# Patient Record
Sex: Male | Born: 1995 | Race: Black or African American | Hispanic: No | Marital: Single | Smoking: Never smoker
Health system: Southern US, Community
[De-identification: ages and names within clinical notes are randomized; demographics above are authoritative.]

## PROBLEM LIST (undated history)

## (undated) DIAGNOSIS — F419 Anxiety disorder, unspecified: Secondary | ICD-10-CM

## (undated) DIAGNOSIS — L309 Dermatitis, unspecified: Secondary | ICD-10-CM

## (undated) HISTORY — DX: Anxiety disorder, unspecified: F41.9

---
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
## 2018-09-20 ENCOUNTER — Other Ambulatory Visit: Payer: Self-pay

## 2018-09-20 ENCOUNTER — Emergency Department (HOSPITAL_COMMUNITY)
Admission: EM | Admit: 2018-09-20 | Discharge: 2018-09-20 | Disposition: A | Discharge status: Home / Self Care | Payer: BC Managed Care – PPO | Attending: Emergency Medicine | Admitting: Emergency Medicine

## 2018-09-20 ENCOUNTER — Encounter (HOSPITAL_COMMUNITY): Payer: Self-pay

## 2018-09-20 ENCOUNTER — Emergency Department (HOSPITAL_COMMUNITY): Payer: BC Managed Care – PPO

## 2018-09-20 DIAGNOSIS — Y999 Unspecified external cause status: Secondary | ICD-10-CM | POA: Insufficient documentation

## 2018-09-20 DIAGNOSIS — Y9301 Activity, walking, marching and hiking: Secondary | ICD-10-CM | POA: Insufficient documentation

## 2018-09-20 DIAGNOSIS — Z79899 Other long term (current) drug therapy: Secondary | ICD-10-CM | POA: Insufficient documentation

## 2018-09-20 DIAGNOSIS — Y929 Unspecified place or not applicable: Secondary | ICD-10-CM | POA: Diagnosis not present

## 2018-09-20 DIAGNOSIS — S93601A Unspecified sprain of right foot, initial encounter: Secondary | ICD-10-CM | POA: Diagnosis not present

## 2018-09-20 DIAGNOSIS — S99921A Unspecified injury of right foot, initial encounter: Secondary | ICD-10-CM | POA: Diagnosis present

## 2018-09-20 DIAGNOSIS — X503XXA Overexertion from repetitive movements, initial encounter: Secondary | ICD-10-CM | POA: Diagnosis not present

## 2018-09-20 MED ORDER — NAPROXEN 375 MG PO TABS: 375.0000 mg | ORAL_TABLET | Freq: Two times a day (BID) | ORAL | 0 refills | Status: AC

## 2018-09-20 MED ORDER — NAPROXEN 500 MG PO TABS
500.0000 mg | ORAL_TABLET | Freq: Once | ORAL | Status: AC
  Administered 2018-09-20: 500 mg via ORAL
  Filled 2018-09-20: qty 1

## 2018-09-20 NOTE — ED Provider Notes (Signed)
[facility]-EMERGENCY DEPT Provider Note   CSN: [id number] Arrival date & time: 09/20/18  1208    History   Chief Complaint Chief Complaint  Patient presents with  . Foot Injury    HPI Carlos Underwood is a 23 y.o. male.     23 y/o male with a PMH of Eczema presents to the ED with a chief complaint of right ankle pain x 2 days. 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 Patient reports pain along the dorsum aspect of his foot, states its worse with dorsiflexion and ambulation. He has taken tylenol for his pain, along with ace wrap it but reports no relieve in symptoms. He is also ambulating with crutches which helps. He denies any fever, other complaints or injury at this time.     Past Medical History:  Diagnosis Date  . Eczema     There are no active problems to display for this patient.   History reviewed. No pertinent surgical history.      Home Medications    Prior to Admission medications   Medication Sig Start Date End Date Taking? Authorizing Provider  acetaminophen (TYLENOL) 500 MG tablet Take 1,000 mg by mouth every 6 (six) hours as needed. For pain    [provider]  amoxicillin (AMOXIL) 500 MG capsule Take 2 capsules (1,000 mg total) by mouth 2 (two) times daily. 12/03/15   Mat Carne, MD  cephALEXin (KEFLEX) 500 MG capsule Take 1 capsule (500 mg total) by mouth 4 (four) times daily. 09/04/15   Elpidio Anis, PA-C  Fexofenadine HCl (MUCINEX ALLERGY PO) Take by mouth.    [provider]  HYDROcodone-acetaminophen (NORCO/VICODIN) 5-325 MG tablet Take 1-2 tablets by mouth every 4 (four) hours as needed. 09/04/15   Elpidio Anis, PA-C  hydrOXYzine (ATARAX/VISTARIL) 25 MG tablet Take 1 tablet (25 mg total) by mouth every 6 (six) hours as needed for anxiety. 08/27/16   Earley Favor, NP  ibuprofen (ADVIL,MOTRIN) 600 MG tablet Take 1 tablet (600 mg total) by mouth  every 6 (six) hours as needed. 09/04/15   Elpidio Anis, PA-C  ipratropium (ATROVENT) 0.06 % nasal spray Place 2 sprays into both nostrils 4 (four) times daily. 02/17/15   Hayden Rasmussen, NP  naproxen (NAPROSYN) 375 MG tablet Take 1 tablet (375 mg total) by mouth 2 (two) times daily for 7 days. 09/20/18 09/27/18  Claude Manges, PA-C    Family History Family History  Problem Relation Age of Onset  . Heart failure Mother   . Healthy Father     Social History Social History   Tobacco Use  . Smoking status: Never Smoker  . Smokeless tobacco: Never Used  Substance Use Topics  . Alcohol use: Yes    Comment: social   . Drug use: Yes    Types: Marijuana     Allergies   Patient has no known allergies.   Review of Systems Review of Systems  Constitutional: Negative for fever.  Musculoskeletal: Positive for arthralgias.     Physical Exam Updated Vital Signs BP 113/72 (BP Location: Right Arm)   Pulse (!) 51   Temp 98.1 F (36.7 C) (Oral)   Resp 14   Ht 6\' 3"  (1.905 m)   Wt 70.3 kg   SpO2 99%   BMI 19.37 kg/m   Physical Exam Vitals signs and nursing note reviewed.  Constitutional:      Appearance: He is well-developed.  HENT:  Head: Normocephalic and atraumatic.  Eyes:     General: No scleral icterus.    Pupils: Pupils are equal, round, and reactive to light.  Neck:     Musculoskeletal: Normal range of motion.  Cardiovascular:     Heart sounds: Normal heart sounds.  Pulmonary:     Effort: Pulmonary effort is normal.     Breath sounds: Normal breath sounds. No wheezing.  Chest:     Chest wall: No tenderness.  Abdominal:     General: Bowel sounds are normal. There is no distension.     Palpations: Abdomen is soft.     Tenderness: There is no abdominal tenderness.  Musculoskeletal:        General: No tenderness or deformity.     Right ankle: He exhibits swelling. He exhibits normal range of motion, no ecchymosis, no deformity, no laceration and normal pulse.        Feet:     Comments: Pulses present, capillary refill intact. 5/5 strength with dorsiflexion and plantarflexion.   Skin:    General: Skin is warm and dry.  Neurological:     Mental Status: He is alert and oriented to person, place, and time.      ED Treatments / Results  Labs (all labs ordered are listed, but only abnormal results are displayed) Labs Reviewed - No data to display  EKG None  Radiology Dg Ankle Complete Right  Result Date: 09/20/2018 CLINICAL DATA:  Twisted ankle. EXAM: RIGHT ANKLE - COMPLETE 3+ VIEW COMPARISON:  None. FINDINGS: Minimal soft tissue swelling about the lateral malleolus. No associated fracture or dislocation. Joint spaces are preserved. Ankle mortise is preserved. No definite ankle joint effusion. No plantar calcaneal spur. No radiopaque foreign body. IMPRESSION: Minimal soft tissue swelling about the lateral malleolus without associated fracture or dislocation. Electronically Signed   By: Simonne Come M.D.   On: 09/20/2018 13:28    Procedures Procedures (including critical care time)  Medications Ordered in ED Medications  naproxen (NAPROSYN) tablet 500 mg (500 mg Oral Given 09/20/18 1335)     Initial Impression / Assessment and Plan / ED Course  I have reviewed the triage vital signs and the nursing notes.  Pertinent labs & imaging results that were available during my care of the patient were reviewed by me and considered in my medical decision making (see chart for details).    Patient presents to the ED status post fall 2 days ago.  Reports pain around the right ankle, worse with dorsiflexion and ambulation.  Duration patient is got pulses, capillary refill is intact, strength is 5 out of 5.  Will obtain x-ray to rule out any acute fracture or dislocation.  X-ray of his right foot showed: Minimal soft tissue swelling about the lateral malleolus without  associated fracture or dislocation.     Patient does have an Ace wrap from home,  along with a boot that he reports getting from his brother.  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  Is requesting a work note at this time to return to work on Thursday.  Patient has been advised to elevate, rice therapy.  Will have patient follow-up with orthopedist as needed.  Patient understands and agrees with management at this time.  Vitals stable, ambulatory with crutches, stable for discharge at this time.     Final Clinical Impressions(s) / ED Diagnoses   Final diagnoses:  Sprain of right foot, initial encounter    ED Discharge Orders  Ordered    naproxen (NAPROSYN) 375 MG tablet  2 times daily     09/20/18 1343           Claude MangesSoto, Tu Bayle, PA-C 09/20/18 1352    Pricilla LovelessGoldston, Scott, MD 09/20/18 [id number]

## 2018-09-20 NOTE — ED Notes (Signed)
XR at bedside

## 2018-09-20 NOTE — ED Triage Notes (Signed)
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 Patient has swelling to the right foot and ankle. Patient has more pain on top of the foot, especially when applies weight.

## 2018-09-20 NOTE — Discharge Instructions (Addendum)
You xray today was normal, no fracture or dislocation. I have provided a prescription to help with the swelling, please take this as prescription.

## 2019-06-16 ENCOUNTER — Emergency Department (HOSPITAL_COMMUNITY)
Admission: EM | Admit: 2019-06-16 | Discharge: 2019-06-17 | Discharge status: Against Medical Advice | Payer: BC Managed Care – PPO

## 2019-06-16 NOTE — ED Notes (Signed)
Pt called for triage but no answer in lobby 

## 2019-06-17 ENCOUNTER — Encounter (HOSPITAL_COMMUNITY): Payer: Self-pay | Admitting: Emergency Medicine

## 2019-06-17 ENCOUNTER — Other Ambulatory Visit: Payer: Self-pay

## 2019-06-17 NOTE — ED Notes (Signed)
Pt called for room no answer

## 2019-06-17 NOTE — ED Notes (Signed)
Pt called for VS, no answer  

## 2019-06-17 NOTE — ED Notes (Signed)
Pt called x3 for vitals recheck, no answer. 

## 2019-06-17 NOTE — ED Triage Notes (Signed)
Patient reports stye at left lower eyelid onset this week with redness/swelling , no blurred vision or vision loss.

## 2020-02-07 ENCOUNTER — Emergency Department (HOSPITAL_COMMUNITY): Payer: BC Managed Care – PPO

## 2020-02-07 ENCOUNTER — Encounter (HOSPITAL_COMMUNITY): Payer: Self-pay | Admitting: Emergency Medicine

## 2020-02-07 ENCOUNTER — Emergency Department (HOSPITAL_COMMUNITY)
Admission: EM | Admit: 2020-02-07 | Discharge: 2020-02-08 | Disposition: A | Discharge status: Home / Self Care | Payer: BC Managed Care – PPO | Attending: Emergency Medicine | Admitting: Emergency Medicine

## 2020-02-07 DIAGNOSIS — S51812A Laceration without foreign body of left forearm, initial encounter: Secondary | ICD-10-CM | POA: Diagnosis present

## 2020-02-07 DIAGNOSIS — S61512A Laceration without foreign body of left wrist, initial encounter: Secondary | ICD-10-CM | POA: Diagnosis not present

## 2020-02-07 DIAGNOSIS — Z79899 Other long term (current) drug therapy: Secondary | ICD-10-CM | POA: Diagnosis not present

## 2020-02-07 DIAGNOSIS — S40922A Unspecified superficial injury of left upper arm, initial encounter: Secondary | ICD-10-CM | POA: Diagnosis not present

## 2020-02-07 DIAGNOSIS — Y939 Activity, unspecified: Secondary | ICD-10-CM | POA: Diagnosis not present

## 2020-02-07 DIAGNOSIS — W25XXXA Contact with sharp glass, initial encounter: Secondary | ICD-10-CM | POA: Insufficient documentation

## 2020-02-07 DIAGNOSIS — S4992XA Unspecified injury of left shoulder and upper arm, initial encounter: Secondary | ICD-10-CM

## 2020-02-07 DIAGNOSIS — Y999 Unspecified external cause status: Secondary | ICD-10-CM | POA: Diagnosis not present

## 2020-02-07 DIAGNOSIS — Y929 Unspecified place or not applicable: Secondary | ICD-10-CM | POA: Insufficient documentation

## 2020-02-07 DIAGNOSIS — T07XXXA Unspecified multiple injuries, initial encounter: Secondary | ICD-10-CM

## 2020-02-07 NOTE — ED Triage Notes (Signed)
Patient presents with laceration to RUE/wrist area after punching through glass window. Bleeding controlled. Tetanus not UTD

## 2020-02-08 ENCOUNTER — Encounter (HOSPITAL_COMMUNITY): Payer: Self-pay | Admitting: Student

## 2020-02-08 ENCOUNTER — Other Ambulatory Visit: Payer: Self-pay

## 2020-02-08 MED ORDER — CEPHALEXIN 500 MG PO CAPS: 500.0000 mg | ORAL_CAPSULE | Freq: Three times a day (TID) | ORAL | 0 refills | Status: DC

## 2020-02-08 MED ORDER — LIDOCAINE HCL (PF) 1 % IJ SOLN
30.0000 mL | Freq: Once | INTRAMUSCULAR | Status: AC
  Administered 2020-02-08: 08:00:00 30 mL
  Filled 2020-02-08: qty 30

## 2020-02-08 MED ORDER — BACITRACIN ZINC 500 UNIT/GM EX OINT
TOPICAL_OINTMENT | Freq: Once | CUTANEOUS | Status: AC
  Filled 2020-02-08: qty 0.9

## 2020-02-08 NOTE — ED Notes (Signed)
Sutures were covered with the ordered ointment, then with nonstick gauze and then wrapped with Kerlex. Then wrist brace was applied over this dressing. Pt was given D/C papers, all D/C instructions was explained & understood.

## 2020-02-08 NOTE — ED Provider Notes (Signed)
[facility] Provider [facility]   CSN: [id number] Arrival date & time: 02/07/20  1818     History Chief Complaint  Patient presents with  . Extremity Laceration    Carlos Underwood is a 24 y.o. male without significant past medical history who presents to the emergency department status post left upper extremity injurywithout significant past medical hx who presents to the ED S/p LUE injury with lacerations present which occurred around 1800 last night. Patient states that he was attempting to dunk a basketball inside and put his hand through a glass door sustaining multiple wounds. Wounds are painful, no alleviating/aggravating factors. No intervention PTA. Denies fever, chills, numbness, weakness, or other areas of injury. Patient is R hand dominant. Last tetanus within past 5 years.   HPI     Past Medical History:  Diagnosis Date  . Eczema     There are no problems to display for this patient.   History reviewed. No pertinent surgical history.     Family History  Problem Relation Age of Onset  . Heart failure Mother   . Healthy Father     Social History   Tobacco Use  . Smoking status: Never Smoker  . Smokeless tobacco: Never Used  Vaping Use  . Vaping Use: Never used  Substance Use Topics  . Alcohol use: Yes    Comment: social   . Drug use: Yes    Types: Marijuana    Home Medications Prior to Admission medications   Medication Sig Start Date End Date Taking? Authorizing Provider  acetaminophen (TYLENOL) 500 MG tablet Take 1,000 mg by mouth every 6 (six) hours as needed. For pain    [provider]  amoxicillin (AMOXIL) 500 MG capsule Take 2 capsules (1,000 mg total) by mouth 2 (two) times daily. 12/03/15   Mat CarneArmstrong, Charletta R, MD  cephALEXin (KEFLEX) 500 MG capsule Take 1 capsule (500 mg total) by mouth 4 (four) times daily. 09/04/15   Elpidio AnisUpstill, Shari, PA-C  Fexofenadine HCl (MUCINEX ALLERGY PO) Take by mouth.     [provider]  HYDROcodone-acetaminophen (NORCO/VICODIN) 5-325 MG tablet Take 1-2 tablets by mouth every 4 (four) hours as needed. 09/04/15   Elpidio AnisUpstill, Shari, PA-C  hydrOXYzine (ATARAX/VISTARIL) 25 MG tablet Take 1 tablet (25 mg total) by mouth every 6 (six) hours as needed for anxiety. 08/27/16   Earley FavorSchulz, Gail, NP  ibuprofen (ADVIL,MOTRIN) 600 MG tablet Take 1 tablet (600 mg total) by mouth every 6 (six) hours as needed. 09/04/15   Elpidio AnisUpstill, Shari, PA-C  ipratropium (ATROVENT) 0.06 % nasal spray Place 2 sprays into both nostrils 4 (four) times daily. 02/17/15   Hayden RasmussenMabe, David, NP    Allergies    Patient has no known allergies.  Review of Systems   Review of Systems  Constitutional: Negative for chills and fever.  Respiratory: Negative for shortness of breath.   Cardiovascular: Negative for chest pain.  Gastrointestinal: Negative for abdominal pain and vomiting.  Skin: Positive for wound.  Neurological: Negative for syncope, weakness, numbness and headaches.  All other systems reviewed and are negative.   Physical Exam Updated Vital Signs BP 111/66 (BP Location: Right Arm)   Pulse (!) 106   Temp 98.3 F (36.8 C) (Oral)   Resp 16   Ht 6\' 3"  (1.905 m)   Wt 68 kg   SpO2 100%   BMI 18.75 kg/m   Physical Exam Vitals and nursing note reviewed.  Constitutional:      General:  He is not in acute distress.    Appearance: Normal appearance. He is well-developed. He is not ill-appearing or toxic-appearing.  HENT:     Head: Normocephalic and atraumatic.     Comments: No racoon eyes or battle sign.  Eyes:     General:        Right eye: No discharge.        Left eye: No discharge.     Conjunctiva/sclera: Conjunctivae normal.     Pupils: Pupils are equal, round, and reactive to light.  Neck:     Comments: No midline tenderness.  Cardiovascular:     Rate and Rhythm: Normal rate and regular rhythm.     Pulses:          Radial pulses are 2+ on the right side and 2+ on the left  side.     Comments: 2+ symmetric radial and ulnar pulses bilaterally. Pulmonary:     Effort: Pulmonary effort is normal. No respiratory distress.     Breath sounds: Normal breath sounds. No wheezing, rhonchi or rales.  Chest:     Chest wall: No tenderness.  Abdominal:     General: There is no distension.     Palpations: Abdomen is soft.     Tenderness: There is no abdominal tenderness.  Musculoskeletal:     Cervical back: Normal range of motion and neck supple.     Comments: Upper extremities: Patient has 4 lacerations present to the L forearm/wrist- pictured below. There is a 2.5 cm linear laceration to the volar aspect of the radial wrist/thenar eminence with SQ tissue exposed, 2-3 mm deep. There is a 6 cm linear laceration to the volar aspect of the wrist that is 4 mm deep, no obvious tendon involvement. Mid ulnar forearm there is a 4 cm length laceration with two elliptical wounds present, SQ tissue exposed, 2-3 mm deep. There is an irregularly shaped laceration to the ulnar proximal forearm that is a a total of 8 cm in diameter, skin flap component present, 3-4 mm deep. All wounds are without active bleeding. No obvious tendon injury. Patient has intact throughout. He is able to flex/extend all MCP/IP joints and the wrist against resistance, also able to abduct/adduct the thumb against resistance. Patient is tender over wounds otherwise nontender. No anatomical snuffbox tenderness.   Skin:    General: Skin is warm and dry.     Capillary Refill: Capillary refill takes less than 2 seconds.     Findings: No rash.  Neurological:     Mental Status: He is alert.     Comments: Alert. Clear speech. Sensation grossly intact to bilateral upper extremities. 5/5 symmetric grip strength. Ambulatory. Able to perform oK sign thumbs up and cross 2nd/3rd digits bilaterally.   Psychiatric:        Mood and Affect: Mood normal.        Behavior: Behavior normal.           ED Results / Procedures /  Treatments   Labs (all labs ordered are listed, but only abnormal results are displayed) Labs Reviewed - No data to display  EKG None  Radiology DG Wrist Complete Left  Result Date: 02/07/2020 CLINICAL DATA:  Left wrist laceration after putting arm through a broken glass window EXAM: LEFT WRIST - COMPLETE 3+ VIEW COMPARISON:  None. FINDINGS: Soft tissue laceration along the volar aspect of the wrist with overlying bandaging material. No radiopaque foreign body is identified. No associated osseous defect or acute osseous injury  is seen. IMPRESSION: Soft tissue laceration along the volar aspect of the wrist without retained radiopaque foreign body or associated osseous injury. Electronically Signed   By: Kreg Shropshire M.D.   On: 02/07/2020 19:18    Procedures .Marland KitchenLaceration Repair  Date/Time: 02/08/2020 9:18 AM Performed by: Cherly Anderson, PA-C Authorized by: Cherly Anderson, PA-C   Consent:    Consent obtained:  Verbal   Consent given by:  Patient   Risks discussed:  Infection, need for additional repair, nerve damage, poor wound healing, poor cosmetic result, pain, retained foreign body, tendon damage and vascular damage   Alternatives discussed:  No treatment Anesthesia (see MAR for exact dosages):    Anesthesia method:  Local infiltration   Local anesthetic:  Lidocaine 1% w/o epi Laceration details:    Location: L radial/volar wrist.   Length (cm):  2.5   Depth (mm):  3 Repair type:    Repair type:  Simple Pre-procedure details:    Preparation:  Patient was prepped and draped in usual sterile fashion and imaging obtained to evaluate for foreign bodies Exploration:    Hemostasis achieved with:  Direct pressure   Wound exploration: wound explored through full range of motion and entire depth of wound probed and visualized   Treatment:    Area cleansed with:  Betadine   Amount of cleaning:  Standard   Irrigation solution:  Sterile water   Irrigation method:   Pressure wash Skin repair:    Repair method:  Sutures   Suture size:  4-0   Suture material:  Nylon   Suture technique:  Simple interrupted   Number of sutures:  3 Approximation:    Approximation:  Close Post-procedure details:    Patient tolerance of procedure:  Tolerated well, no immediate complications .Marland KitchenLaceration Repair  Date/Time: 02/08/2020 9:19 AM Performed by: Cherly Anderson, PA-C Authorized by: Cherly Anderson, PA-C   Consent:    Consent obtained:  Verbal   Consent given by:  Patient   Risks discussed:  Need for additional repair, nerve damage, infection, pain, poor cosmetic result, poor wound healing, tendon damage, retained foreign body and vascular damage   Alternatives discussed:  No treatment Anesthesia (see MAR for exact dosages):    Anesthesia method:  Local infiltration   Local anesthetic:  Lidocaine 1% w/o epi Laceration details:    Location: L volar wrist.   Length (cm):  6   Depth (mm):  4 Repair type:    Repair type:  Simple Pre-procedure details:    Preparation:  Patient was prepped and draped in usual sterile fashion and imaging obtained to evaluate for foreign bodies Exploration:    Hemostasis achieved with:  Direct pressure   Wound exploration: wound explored through full range of motion and entire depth of wound probed and visualized     Contaminated: no   Treatment:    Area cleansed with:  Betadine   Amount of cleaning:  Standard   Irrigation solution:  Sterile water   Irrigation method:  Pressure wash Skin repair:    Repair method:  Sutures   Suture size:  4-0   Suture material:  Nylon   Suture technique:  Simple interrupted   Number of sutures:  7 Approximation:    Approximation:  Close Post-procedure details:    Patient tolerance of procedure:  Tolerated well, no immediate complications .Marland KitchenLaceration Repair  Date/Time: 02/08/2020 9:19 AM Performed by: Cherly Anderson, PA-C Authorized by: Cherly Anderson,  PA-C   Consent:  Consent obtained:  Verbal   Consent given by:  Patient   Risks discussed:  Infection, nerve damage, need for additional repair, pain, poor cosmetic result, vascular damage, tendon damage, retained foreign body and poor wound healing   Alternatives discussed:  No treatment Anesthesia (see MAR for exact dosages):    Anesthesia method:  Local infiltration   Local anesthetic:  Lidocaine 1% w/o epi Laceration details:    Location: L ulnar distal forearm.   Length (cm):  4   Depth (mm):  3 Repair type:    Repair type:  Simple Pre-procedure details:    Preparation:  Patient was prepped and draped in usual sterile fashion and imaging obtained to evaluate for foreign bodies Exploration:    Hemostasis achieved with:  Direct pressure   Wound exploration: wound explored through full range of motion and entire depth of wound probed and visualized     Contaminated: no   Treatment:    Area cleansed with:  Betadine   Amount of cleaning:  Standard   Irrigation solution:  Sterile water   Irrigation method:  Pressure wash Skin repair:    Repair method:  Sutures   Suture size:  4-0   Suture material:  Nylon   Number of sutures:  4 Approximation:    Approximation:  Close Post-procedure details:    Patient tolerance of procedure:  Tolerated well, no immediate complications .Marland KitchenLaceration Repair  Date/Time: 02/08/2020 9:20 AM Performed by: Cherly Anderson, PA-C Authorized by: Cherly Anderson, PA-C   Consent:    Consent obtained:  Verbal   Consent given by:  Patient   Risks discussed:  Infection, need for additional repair, nerve damage, poor wound healing, poor cosmetic result, pain, retained foreign body, tendon damage and vascular damage   Alternatives discussed:  No treatment Anesthesia (see MAR for exact dosages):    Anesthesia method:  Local infiltration   Local anesthetic:  Lidocaine 1% w/o epi Laceration details:    Location: L proximal ulnar forearm.    Length (cm):  8   Depth (mm):  4 Repair type:    Repair type:  Simple Pre-procedure details:    Preparation:  Patient was prepped and draped in usual sterile fashion and imaging obtained to evaluate for foreign bodies Exploration:    Hemostasis achieved with:  Direct pressure   Wound exploration: wound explored through full range of motion and entire depth of wound probed and visualized     Contaminated: no   Treatment:    Area cleansed with:  Betadine   Amount of cleaning:  Standard   Irrigation solution:  Sterile water   Irrigation method:  Pressure wash Skin repair:    Repair method:  Sutures   Suture size:  4-0   Suture material:  Nylon   Suture technique:  Simple interrupted   Number of sutures:  13 Approximation:    Approximation:  Close Post-procedure details:    Patient tolerance of procedure:  Tolerated well, no immediate complications   (including critical care time)  Medications Ordered in ED Medications  bacitracin ointment (has no administration in time range)  lidocaine (PF) (XYLOCAINE) 1 % injection 30 mL (30 mLs Infiltration Given 02/08/20 0829)    ED Course  I have reviewed the triage vital signs and the nursing notes.  Pertinent labs & imaging results that were available during my care of the patient were reviewed by me and considered in my medical decision making (see chart for details).    MDM  Rules/Calculators/A&P                          Patient presents to the emergency department with left upper extremity injury with subsequent multiple lacerations present as described and pictured above.  The patient is 13 hours from injury making closure somewhat higher risk, however he does have quite large gaping wounds in multiple locations, we discussed the risk/benefit of closure, ultimately patient would prefer closure in the emergency department.  All wounds were pressure irrigated and visualized in a bloodless field without evidence of foreign body, overall  clean appearing wounds prior to irrigation.  Closure was performed per procedure note above.  Left wrist x-ray was obtained per triage, no radiopaque foreign bodies noted, this does extend to about the area of the forearm where his most proximal laceration is.  Nonstick dressing and antibiotic ointment was applied as well as a wrist splint for stability.  Tetanus is up-to-date.  Patient neurovascularly intact distally prior to and following procedure with 2+ radial and ulnar pulses as well as ability to form okay sign, thumbs up, and cross second/third digits bilaterally and sensation grossly intact sensation.  He is also able to flex/extend all digits against resistance.  Will provide short course of prophylactic antibiotics given delayed closure.  Discussed wound care at home as well as suture removal in 7 to 10 days. I discussed results, treatment plan, need for follow-up, and return precautions with the patient. Provided opportunity for questions, patient confirmed understanding and is in agreement with plan.   Final Clinical Impression(s) / ED Diagnoses Final diagnoses:  Multiple lacerations  Injury of left upper extremity, initial encounter    Rx / DC Orders ED Discharge Orders    None       Cherly Anderson, PA-C 02/08/20 1610    Geoffery Lyons, MD 02/08/20 1353

## 2020-02-08 NOTE — ED Notes (Signed)
Lidocaine given to PA.

## 2020-02-08 NOTE — Discharge Instructions (Addendum)
You were seen in the emergency department today for a left upper extremity injury with multiple lacerations.  Your x-ray did not show any broken bones.  Your lacerations were cleaned and irrigated.  Each of the lacerations was closed with stitches-there were a total of 22 nonabsorbable stitches in your wounds.  Please keep this area clean and dry for the next 24 hours, after 24 hours you may get this area wet, but avoid soaking the area. Keep the area covered as best possible especially when in the sun to help in minimizing scarring.  Please wear the wrist brace for the first 72 hours to help maintain stitching.  We are sending you with a few days of Keflex to take to help with infection. We have prescribed you new medication(s) today. Discuss the medications prescribed today with your pharmacist as they can have adverse effects and interactions with your other medicines including over the counter and prescribed medications. Seek medical evaluation if you start to experience new or abnormal symptoms after taking one of these medicines, seek care immediately if you start to experience difficulty breathing, feeling of your throat closing, facial swelling, or rash as these could be indications of a more serious allergic reaction   You will need to have the stitches removed and the wound rechecked in 7-10 days. Please return to the emergency department, go to an urgent care, or see your primary care provider to have this performed. Return to the ER soon should you start to experience pus type drainage from the wound, redness around the wound, or fevers as this could indicate the area is infected, numbness, weakness, increased swelling, please return to the ER for any other worsening symptoms or concerns that you may have.

## 2020-02-15 ENCOUNTER — Encounter (HOSPITAL_COMMUNITY): Payer: Self-pay | Admitting: *Deleted

## 2020-02-15 ENCOUNTER — Other Ambulatory Visit: Payer: Self-pay

## 2020-02-15 ENCOUNTER — Emergency Department (HOSPITAL_COMMUNITY)
Admission: EM | Admit: 2020-02-15 | Discharge: 2020-02-15 | Disposition: A | Discharge status: Home / Self Care | Payer: BC Managed Care – PPO | Attending: Emergency Medicine | Admitting: Emergency Medicine

## 2020-02-15 DIAGNOSIS — Z4802 Encounter for removal of sutures: Secondary | ICD-10-CM | POA: Insufficient documentation

## 2020-02-15 DIAGNOSIS — Z79899 Other long term (current) drug therapy: Secondary | ICD-10-CM | POA: Insufficient documentation

## 2020-02-15 DIAGNOSIS — S51812D Laceration without foreign body of left forearm, subsequent encounter: Secondary | ICD-10-CM | POA: Insufficient documentation

## 2020-02-15 DIAGNOSIS — X58XXXA Exposure to other specified factors, initial encounter: Secondary | ICD-10-CM | POA: Diagnosis not present

## 2020-02-15 DIAGNOSIS — M791 Myalgia, unspecified site: Secondary | ICD-10-CM | POA: Diagnosis not present

## 2020-02-15 NOTE — ED Notes (Signed)
Suture removal and wound care completed by [facility] PA

## 2020-02-15 NOTE — ED Provider Notes (Signed)
[facility]-EMERGENCY DEPT Provider Note   CSN: [id number] Arrival date & time: 02/15/20  0750     History Chief Complaint  Patient presents with  . Suture / Staple Removal    REYHAN MORONTA is a 24 y.o. male.  HPI Patient is a 24 year old male who presents to the emergency department for suture removal.  Patient had 3 large wounds on his left lower arm which were all sutured on September 1.  Patient states that he has been wrapping the wounds and cleaning them daily.  He reports some mild itchiness overlying the sites but otherwise has no complaints.  No fevers, chills, swelling, increased warmth, discharge, nausea, vomiting.    Past Medical History:  Diagnosis Date  . Eczema     There are no problems to display for this patient.   History reviewed. No pertinent surgical history.     Family History  Problem Relation Age of Onset  . Heart failure Mother   . Healthy Father     Social History   Tobacco Use  . Smoking status: Never Smoker  . Smokeless tobacco: Never Used  Vaping Use  . Vaping Use: Never used  Substance Use Topics  . Alcohol use: Yes    Comment: social   . Drug use: Yes    Types: Marijuana    Home Medications Prior to Admission medications   Medication Sig Start Date End Date Taking? Authorizing Provider  acetaminophen (TYLENOL) 500 MG tablet Take 1,000 mg by mouth every 6 (six) hours as needed. For pain    [provider]  cephALEXin (KEFLEX) 500 MG capsule Take 1 capsule (500 mg total) by mouth 3 (three) times daily. 02/08/20   Petrucelli, Samantha R, PA-C  Fexofenadine HCl (MUCINEX ALLERGY PO) Take by mouth.    [provider]  HYDROcodone-acetaminophen (NORCO/VICODIN) 5-325 MG tablet Take 1-2 tablets by mouth every 4 (four) hours as needed. 09/04/15   Elpidio Anis, PA-C  hydrOXYzine (ATARAX/VISTARIL) 25 MG tablet Take 1 tablet (25 mg total) by mouth every 6 (six) hours as needed for anxiety. 08/27/16    Earley Favor, NP  ibuprofen (ADVIL,MOTRIN) 600 MG tablet Take 1 tablet (600 mg total) by mouth every 6 (six) hours as needed. 09/04/15   Elpidio Anis, PA-C  ipratropium (ATROVENT) 0.06 % nasal spray Place 2 sprays into both nostrils 4 (four) times daily. 02/17/15   Hayden Rasmussen, NP    Allergies    Patient has no known allergies.  Review of Systems   Review of Systems  Constitutional: Negative for chills and fever.  Gastrointestinal: Negative for nausea and vomiting.  Musculoskeletal: Positive for myalgias.  Skin: Positive for wound.   Physical Exam Updated Vital Signs BP 112/80 (BP Location: Right Arm)   Pulse (!) 58   Temp 97.9 F (36.6 C) (Oral)   Resp 16   Ht 6\' 3"  (1.905 m)   Wt 81.6 kg   SpO2 100%   BMI 22.50 kg/m   Physical Exam Vitals and nursing note reviewed.  Constitutional:      General: He is not in acute distress.    Appearance: He is well-developed.  HENT:     Head: Normocephalic and atraumatic.     Right Ear: External ear normal.     Left Ear: External ear normal.  Eyes:     General: No scleral icterus.       Right eye: No discharge.        Left eye: No discharge.  Conjunctiva/sclera: Conjunctivae normal.  Neck:     Trachea: No tracheal deviation.  Cardiovascular:     Rate and Rhythm: Normal rate.  Pulmonary:     Effort: Pulmonary effort is normal. No respiratory distress.     Breath sounds: No stridor.  Abdominal:     General: There is no distension.  Musculoskeletal:        General: No swelling or deformity.     Cervical back: Neck supple.  Skin:    General: Skin is warm and dry.     Findings: No erythema or rash.     Comments: 3 well-healed linear lacerations noted to the left lower arm.  All sutures intact.  No increased warmth, discharge, induration.  No overlying or surrounding erythema.  Minimal tenderness noted overlying the wounds.  Neurological:     Mental Status: He is alert.     Cranial Nerves: Cranial nerve deficit: no gross  deficits.     ED Results / Procedures / Treatments   Labs (all labs ordered are listed, but only abnormal results are displayed) Labs Reviewed - No data to display  EKG None  Radiology No results found.  Procedures .Suture Removal  Date/Time: 02/15/2020 9:11 AM Performed by: Placido Sou, PA-C Authorized by: Placido Sou, PA-C   Consent:    Consent obtained:  Verbal   Consent given by:  Patient Location:    Location:  Upper extremity   Upper extremity location:  Arm   Arm location:  L lower arm Procedure details:    Wound appearance:  No signs of infection and good wound healing   Number of sutures removed:  4 Post-procedure details:    Patient tolerance of procedure:  Tolerated well, no immediate complications .Suture Removal  Date/Time: 02/15/2020 9:12 AM Performed by: Placido Sou, PA-C Authorized by: Placido Sou, PA-C   Consent:    Consent obtained:  Verbal   Consent given by:  Patient   Risks discussed:  Bleeding and pain Location:    Location:  Upper extremity   Upper extremity location:  Arm   Arm location:  L lower arm Procedure details:    Wound appearance:  No signs of infection, good wound healing and clean   Number of sutures removed:  13 Post-procedure details:    Patient tolerance of procedure:  Tolerated well, no immediate complications .Suture Removal  Date/Time: 02/15/2020 9:13 AM Performed by: Placido Sou, PA-C Authorized by: Placido Sou, PA-C   Consent:    Consent obtained:  Verbal   Consent given by:  Patient   Risks discussed:  Bleeding and pain Location:    Location:  Upper extremity   Upper extremity location:  Arm   Arm location:  L lower arm Procedure details:    Wound appearance:  No signs of infection, good wound healing and clean   Number of sutures removed:  7 Post-procedure details:    Post-removal:  Dressing applied and Steri-Strips applied   Patient tolerance of procedure:  Tolerated well, no  immediate complications   Medications Ordered in ED Medications - No data to display  ED Course  I have reviewed the triage vital signs and the nursing notes.  Pertinent labs & imaging results that were available during my care of the patient were reviewed by me and considered in my medical decision making (see chart for details).    MDM Rules/Calculators/A&P  Pt is a 24 y.o. male that presents with a history, physical exam, and ED Clinical Course as noted above.   Patient presents today for suture removal in regards to 3 lacerations on his left lower arm that occurred on the first of this month.  All wounds are healing well with no signs of infection.  Patient has no complaints at this time other than some mild pruritus overlying the wounds.  Discussed continued wound care.  Sutures were removed without complication.  Patient tolerated the procedure well. 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 No signs of infxn and wound is clean. Sutures were removed and steri strips were applied and I then wrapped the wound. Discussed keeping the site bandaged and approximated while at work and he verbalized understanding.   Patient is hemodynamically stable and in NAD at the time of d/c. Evaluation does not show pathology that would require ongoing emergent intervention or inpatient treatment. I explained the diagnosis to the patient. Patient is comfortable with above plan and is stable for discharge at this time. All questions were answered prior to disposition. Strict return precautions for returning to the ED were discussed. Encouraged follow up with PCP.    An After Visit Summary was printed and given to the patient.  Patient discharged to home/self care.  Condition at discharge: Stable  Note: Portions of this report may have been transcribed using voice  recognition software. Every effort was made to ensure accuracy; however, inadvertent computerized transcription errors may be present.    Final Clinical Impression(s) / ED Diagnoses Final diagnoses:  Visit for suture removal    Rx / DC Orders ED Discharge Orders    None       Placido Sou, PA-C 02/15/20 [id number]    Alvira Monday, MD 02/15/20 [phone]

## 2020-02-15 NOTE — ED Triage Notes (Signed)
Returned for suture removal after having sutures placed in R wrist lower arm.

## 2020-02-15 NOTE — Discharge Instructions (Addendum)
Please continue to apply Neosporin or Triple Antibiotic to the cuts on your left arm. I would continue to wrap your wrist prior to work and apply band aids as needed to help with wound closure.   You can always return to the ER with new or worsening symptoms.   It was a pleasure to meet you.

## 2021-07-12 ENCOUNTER — Other Ambulatory Visit: Payer: Self-pay

## 2021-07-12 ENCOUNTER — Ambulatory Visit (INDEPENDENT_AMBULATORY_CARE_PROVIDER_SITE_OTHER): Payer: Self-pay | Admitting: Nurse Practitioner

## 2021-07-12 ENCOUNTER — Encounter: Payer: Self-pay | Admitting: Nurse Practitioner

## 2021-07-12 VITALS — BP 127/59 | HR 67 | Temp 98.2°F | Ht 74.0 in | Wt 161.1 lb

## 2021-07-12 DIAGNOSIS — K5909 Other constipation: Secondary | ICD-10-CM

## 2021-07-12 DIAGNOSIS — Z0000 Encounter for general adult medical examination without abnormal findings: Secondary | ICD-10-CM

## 2021-07-12 NOTE — Patient Instructions (Signed)
You were seen today in the [facility] for wellness. Labs were collected, results will be available via MyChart or, if abnormal, you will be contacted by clinic staff. Please take Miralax as needed for constipation.  Please follow up in 1 yr for wellness exam.

## 2021-07-12 NOTE — Progress Notes (Signed)
[facility] Patient [facility] [address] [place], [place]  [address] Phone:  [phone]   Fax:  [phone] Subjective:   Patient ID: Carlos Underwood, male    DOB: 08-24-1995, 26 y.o.   MRN: [id number]  Chief Complaint  Patient presents with   Establish Care    Pt is here to establish care. pt stated he hasn't seen a doctor in 5 years just wanted a check-up pt is concern about his bowel movement    HPI Carlos Underwood 26 y.o. male with no significant medical history to the [facility] to establish care.   Last PCP visit unknown, currently concerned about possible constipation.   Constipation: Patient complains of constipation.  Stool pattern has been 6 formed stool(s) per week. Onset unknown.  Defecation has been difficult. Co-Morbid conditions:none. Symptoms have been stable. Current Health Habits: Eating fiber? no Exercise?yes - daily Water intake? Yes, amount unknown.  No recent treatment.   States that he is currently employed at [employer]. Endorses having 3-5 sexual partners in the past 6 mths, both protected and unprotected. Endorses marijuana usage, but denies any other substance usage. Denies any other complaints today.  Denies any fever. Denies any fatigue, chest pain, shortness of breath, HA or dizziness. Denies any blurred vision, numbness or tingling.  Past Medical History:  Diagnosis Date   Anxiety    Eczema     History reviewed. No pertinent surgical history.  Family History  Problem Relation Age of Onset   Heart failure Mother    Healthy Father     Social History   Socioeconomic History   Marital status: Single    Spouse name: Not on file   Number of children: Not on file   Years of education: Not on file   Highest education level: Not on file  Occupational History   Not on file  Tobacco Use   Smoking status: Never   Smokeless tobacco: Never  Vaping Use   Vaping Use: Never used  Substance and Sexual Activity   Alcohol use: Yes     Comment: social    Drug use: Yes    Types: Marijuana   Sexual activity: Yes  Other Topics Concern   Not on file  Social History Narrative   Not on file   Social Determinants of Health   Financial Resource Strain: Not on file  Food Insecurity: Not on file  Transportation Needs: Not on file  Physical Activity: Not on file  Stress: Not on file  Social Connections: Not on file  Intimate Partner Violence: Not on file    Outpatient Medications Prior to Visit  Medication Sig Dispense Refill   acetaminophen (TYLENOL) 500 MG tablet Take 1,000 mg by mouth every 6 (six) hours as needed. For pain     Fexofenadine HCl (MUCINEX ALLERGY PO) Take by mouth.     ibuprofen (ADVIL,MOTRIN) 600 MG tablet Take 1 tablet (600 mg total) by mouth every 6 (six) hours as needed. 30 tablet 0   cephALEXin (KEFLEX) 500 MG capsule Take 1 capsule (500 mg total) by mouth 3 (three) times daily. (Patient not taking: Reported on 07/12/2021) 15 capsule 0   HYDROcodone-acetaminophen (NORCO/VICODIN) 5-325 MG tablet Take 1-2 tablets by mouth every 4 (four) hours as needed. (Patient not taking: Reported on 07/12/2021) 6 tablet 0   hydrOXYzine (ATARAX/VISTARIL) 25 MG tablet Take 1 tablet (25 mg total) by mouth every 6 (six) hours as needed for anxiety. (Patient not taking: Reported  on 07/12/2021) 12 tablet 0   ipratropium (ATROVENT) 0.06 % nasal spray Place 2 sprays into both nostrils 4 (four) times daily. (Patient not taking: Reported on 07/12/2021) 15 mL 12   No facility-administered medications prior to visit.    No Known Allergies  Review of Systems  Constitutional:  Negative for chills, fever and malaise/fatigue.  HENT: Negative.    Eyes: Negative.   Respiratory:  Negative for cough and shortness of breath.   Cardiovascular:  Negative for chest pain, palpitations and leg swelling.  Gastrointestinal:  Negative for abdominal pain, blood in stool, constipation, diarrhea, nausea and vomiting.  Genitourinary: Negative.    Musculoskeletal: Negative.   Skin: Negative.   Neurological: Negative.   Psychiatric/Behavioral:  Negative for depression. The patient is not nervous/anxious.   All other systems reviewed and are negative.     Objective:    Physical Exam Vitals reviewed.  Constitutional:      General: He is not in acute distress.    Appearance: Normal appearance. He is normal weight.  HENT:     Head: Normocephalic.     Right Ear: Tympanic membrane, ear canal and external ear normal.     Left Ear: Tympanic membrane, ear canal and external ear normal.     Nose: Nose normal. No congestion or rhinorrhea.     Mouth/Throat:     Mouth: Mucous membranes are moist.     Pharynx: Oropharynx is clear. No oropharyngeal exudate or posterior oropharyngeal erythema.  Eyes:     Extraocular Movements: Extraocular movements intact.     Conjunctiva/sclera: Conjunctivae normal.     Pupils: Pupils are equal, round, and reactive to light.  Neck:     Vascular: No carotid bruit.  Cardiovascular:     Rate and Rhythm: Normal rate and regular rhythm.     Pulses: Normal pulses.     Heart sounds: Normal heart sounds.     Comments: No obvious peripheral edema Pulmonary:     Effort: Pulmonary effort is normal.     Breath sounds: Normal breath sounds.  Abdominal:     General: Abdomen is flat. Bowel sounds are normal. There is no distension.     Palpations: Abdomen is soft. There is no mass.     Tenderness: There is no abdominal tenderness. There is no right CVA tenderness, left CVA tenderness, guarding or rebound.     Hernia: No hernia is present.  Musculoskeletal:        General: No swelling, tenderness, deformity or signs of injury. Normal range of motion.     Cervical back: Normal range of motion and neck supple. No rigidity or tenderness.     Right lower leg: No edema.     Left lower leg: No edema.  Lymphadenopathy:     Cervical: No cervical adenopathy.  Skin:    General: Skin is warm and dry.     Capillary  Refill: Capillary refill takes less than 2 seconds.  Neurological:     General: No focal deficit present.     Mental Status: He is alert and oriented to person, place, and time.  Psychiatric:        Mood and Affect: Mood normal.        Behavior: Behavior normal.        Thought Content: Thought content normal.        Judgment: Judgment normal.    BP (!) 127/59    Pulse 67    Temp 98.2 F (36.8 C)  Ht 6\' 2"  (1.88 m)    Wt 161 lb 2 oz (73.1 kg)    SpO2 98%    BMI 20.69 kg/m  Wt Readings from Last 3 Encounters:  07/12/21 161 lb 2 oz (73.1 kg)  02/15/20 180 lb (81.6 kg)  02/07/20 150 lb (68 kg)     There is no immunization history on file for this patient.  Diabetic Foot Exam - Simple   No data filed     No results found for: TSH Lab Results  Component Value Date   WBC 4.5 08/27/2016   HGB 14.0 08/27/2016   HCT 41.5 08/27/2016   MCV 87.7 08/27/2016   PLT 206 08/27/2016   Lab Results  Component Value Date   NA 135 08/27/2016   K 3.6 08/27/2016   CO2 25 08/27/2016   GLUCOSE 88 08/27/2016   BUN 9 08/27/2016   CREATININE 0.90 08/27/2016   BILITOT 0.8 05/22/2015   ALKPHOS 64 05/22/2015   AST 22 05/22/2015   ALT 12 (L) 05/22/2015   PROT 7.1 05/22/2015   ALBUMIN 4.3 05/22/2015   CALCIUM 9.3 08/27/2016   ANIONGAP 9 08/27/2016   No results found for: CHOL No results found for: HDL No results found for: LDLCALC No results found for: TRIG No results found for: CHOLHDL No results found for: 08/29/2016     Assessment & Plan:   Problem List Items Addressed This Visit   None Visit Diagnoses     Well adult exam    -  Primary   Relevant Orders   CBC with Differential/Platelet   Comprehensive metabolic panel   Lipid panel   HIV antibody (with reflex) Discussed safe sex practices   Other constipation     Discussed diet options Informed to take OTC Miralax as needed for symptoms   Follow up in 1 yr for wellness exam, sooner as needed     I am having [id number] A.  Harrold Donath maintain his acetaminophen, Fexofenadine HCl (MUCINEX ALLERGY PO), ipratropium, HYDROcodone-acetaminophen, ibuprofen, hydrOXYzine, and cephALEXin.  No orders of the defined types were placed in this encounter.    Kizzie Bane, NP

## 2021-07-13 LAB — COMPREHENSIVE METABOLIC PANEL
ALT: 20 IU/L (ref 0–44)
AST: 28 IU/L (ref 0–40)
Albumin/Globulin Ratio: 2.2 (ref 1.2–2.2)
Albumin: 4.7 g/dL (ref 4.1–5.2)
Alkaline Phosphatase: 52 IU/L (ref 44–121)
BUN/Creatinine Ratio: 9 (ref 9–20)
BUN: 9 mg/dL (ref 6–20)
Bilirubin Total: <0.2 mg/dL (ref 0.0–1.2)
CO2: 27 mmol/L (ref 20–29)
Calcium: 9.4 mg/dL (ref 8.7–10.2)
Chloride: 104 mmol/L (ref 96–106)
Creatinine, Ser: 0.95 mg/dL (ref 0.76–1.27)
Globulin, Total: 2.1 g/dL (ref 1.5–4.5)
Glucose: 68 mg/dL — ABNORMAL LOW (ref 70–99)
Potassium: 4.1 mmol/L (ref 3.5–5.2)
Sodium: 143 mmol/L (ref 134–144)
Total Protein: 6.8 g/dL (ref 6.0–8.5)
eGFR: 115 mL/min/{1.73_m2} (ref >59)

## 2021-07-13 LAB — CBC WITH DIFFERENTIAL/PLATELET
Basophils Absolute: 0 10*3/uL (ref 0.0–0.2)
Basos: 1 %
EOS (ABSOLUTE): 0.1 10*3/uL (ref 0.0–0.4)
Eos: 1 %
Hematocrit: 40.7 % (ref 37.5–51.0)
Hemoglobin: 13.6 g/dL (ref 13.0–17.7)
Immature Grans (Abs): 0 10*3/uL (ref 0.0–0.1)
Immature Granulocytes: 0 %
Lymphocytes Absolute: 1.9 10*3/uL (ref 0.7–3.1)
Lymphs: 43 %
MCH: 31.2 pg (ref 26.6–33.0)
MCHC: 33.4 g/dL (ref 31.5–35.7)
MCV: 93 fL (ref 79–97)
Monocytes Absolute: 0.5 10*3/uL (ref 0.1–0.9)
Monocytes: 12 %
Neutrophils Absolute: 1.9 10*3/uL (ref 1.4–7.0)
Neutrophils: 43 %
Platelets: 227 10*3/uL (ref 150–450)
RBC: 4.36 x10E6/uL (ref 4.14–5.80)
RDW: 12 % (ref 11.6–15.4)
WBC: 4.3 10*3/uL (ref 3.4–10.8)

## 2021-07-13 LAB — LIPID PANEL
Chol/HDL Ratio: 2.4 ratio (ref 0.0–5.0)
Cholesterol, Total: 144 mg/dL (ref 100–199)
HDL: 61 mg/dL (ref >39)
LDL Chol Calc (NIH): 68 mg/dL (ref 0–99)
Triglycerides: 79 mg/dL (ref 0–149)
VLDL Cholesterol Cal: 15 mg/dL (ref 5–40)

## 2021-07-13 LAB — HIV ANTIBODY (ROUTINE TESTING W REFLEX): HIV Screen 4th Generation wRfx: NONREACTIVE

## 2022-04-14 IMAGING — CR DG WRIST COMPLETE 3+V*L*
4 series · 4 of 4 positions shown · non-contrast
Comparison: None.

CLINICAL DATA: Left wrist laceration after putting arm through a
broken glass window

EXAM:
LEFT WRIST - COMPLETE 3+ VIEW

[wrist pa]
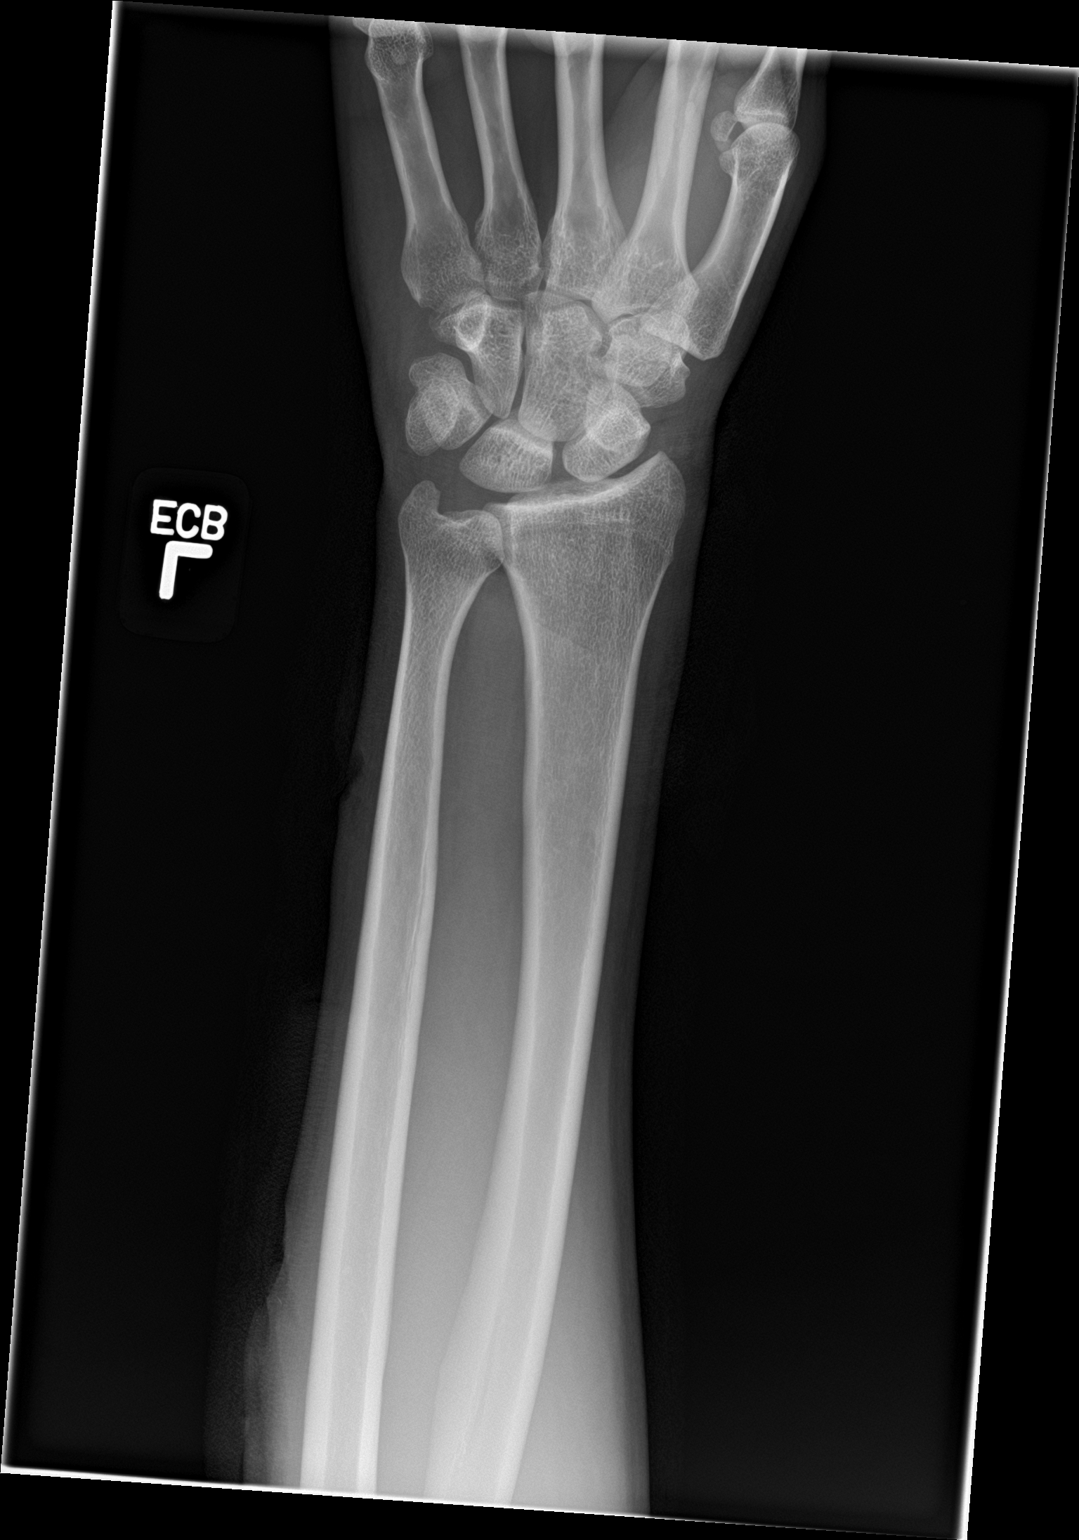

[wrist obl]
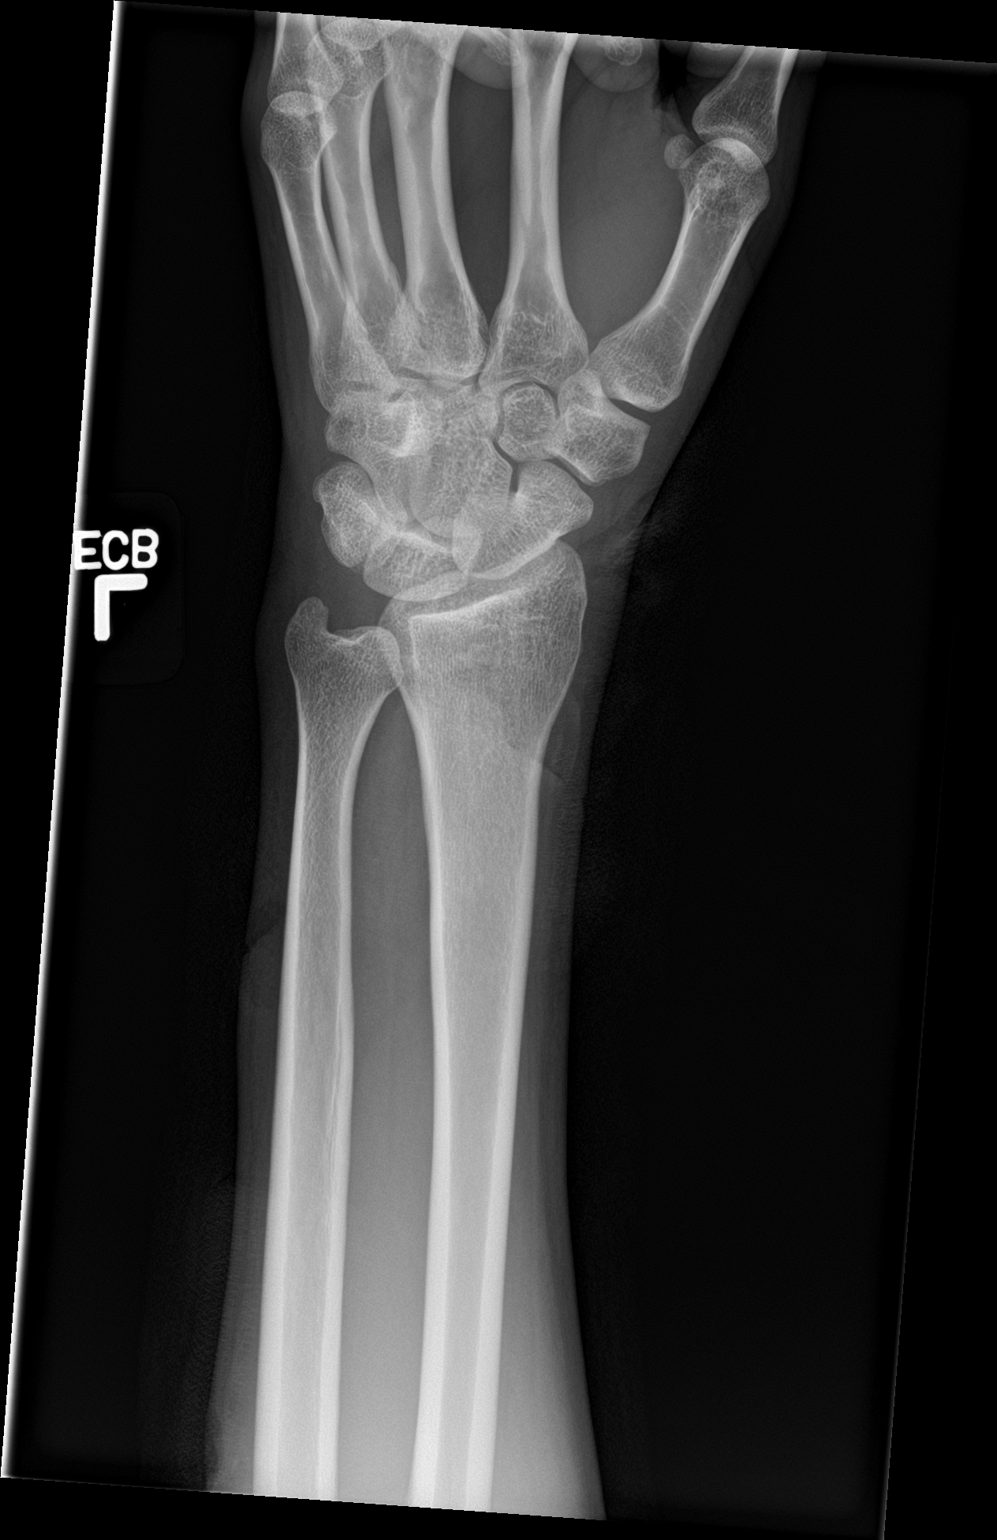

[wrist lat]
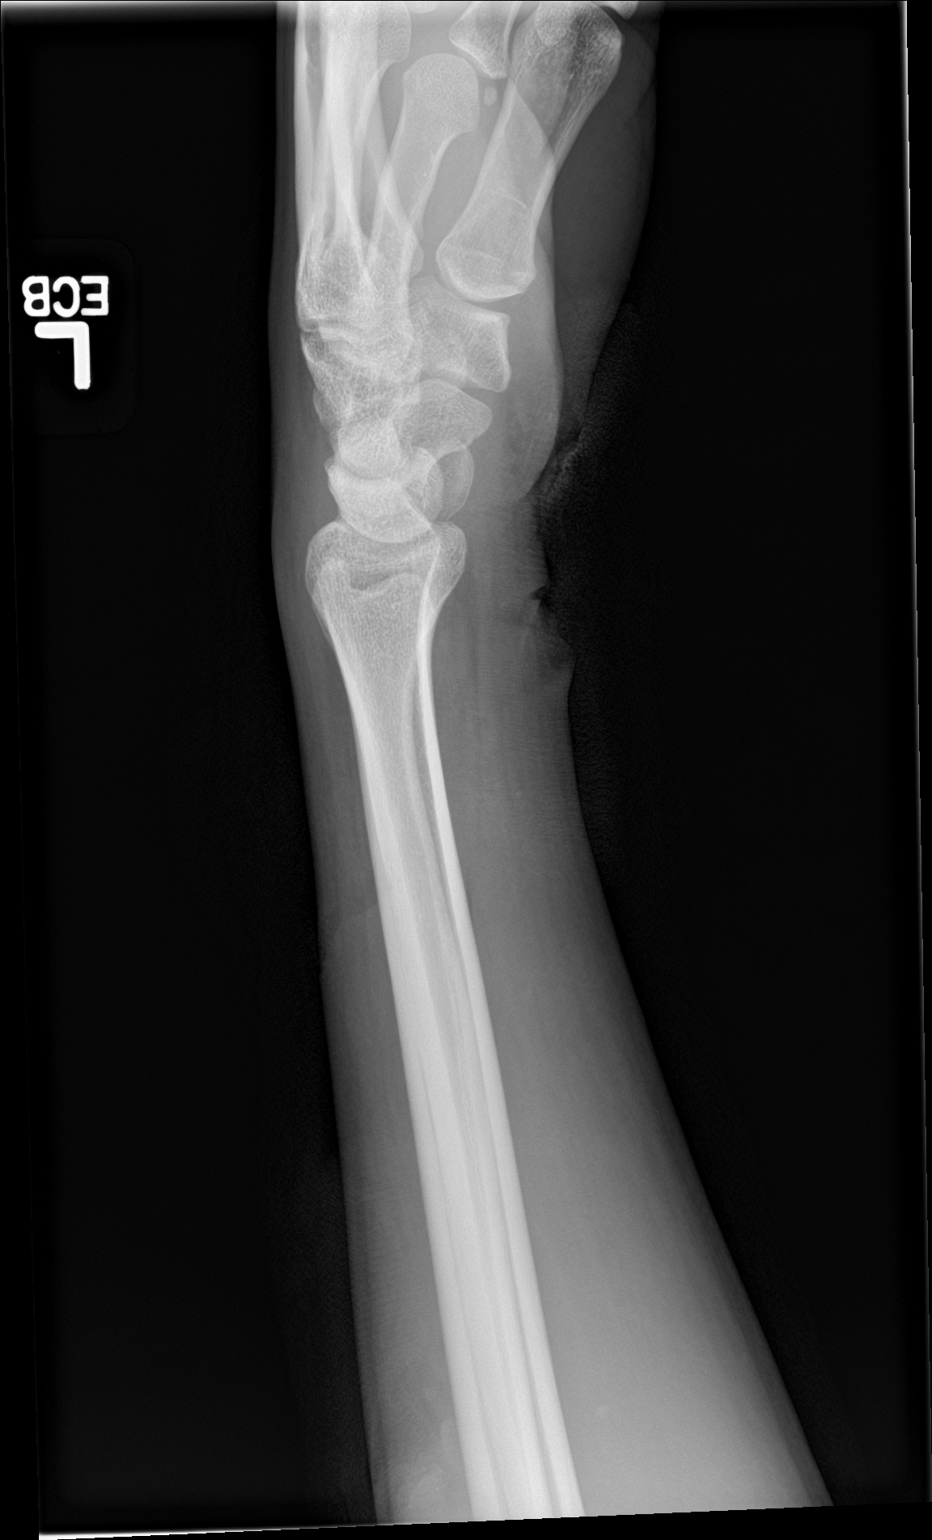

[wrist navicular]
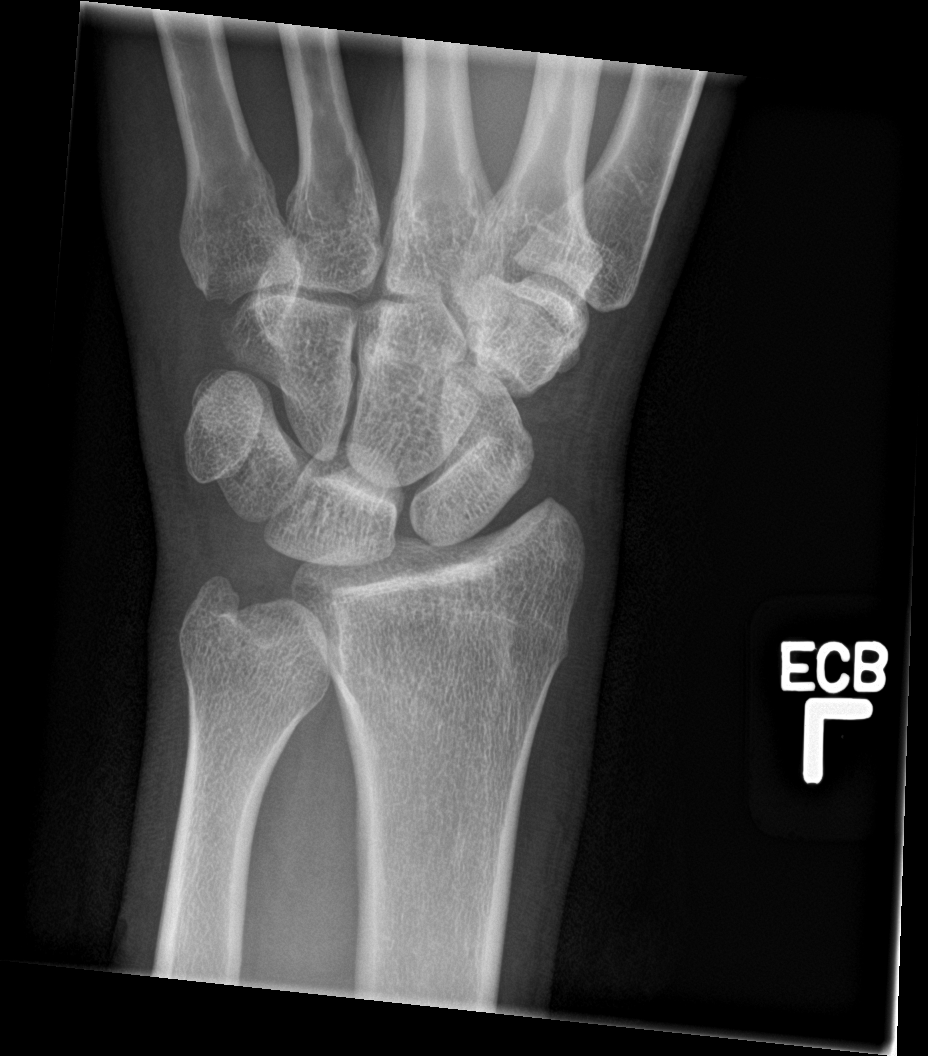

[4 of 4 positions shown; findings below may reference images not displayed]

FINDINGS: Soft tissue laceration along the volar aspect of the wrist with
overlying bandaging material. No radiopaque foreign body is
identified. No associated osseous defect or acute osseous injury is
seen.
IMPRESSION: Soft tissue laceration along the volar aspect of the wrist without
retained radiopaque foreign body or associated osseous injury.

## 2022-07-14 ENCOUNTER — Encounter: Payer: Self-pay | Admitting: Nurse Practitioner

## 2022-07-16 ENCOUNTER — Encounter: Payer: Self-pay | Admitting: Nurse Practitioner

## 2022-09-01 ENCOUNTER — Encounter: Payer: Self-pay | Admitting: Nurse Practitioner

## 2022-09-01 ENCOUNTER — Ambulatory Visit: Payer: Medicaid Other | Admitting: Nurse Practitioner

## 2022-09-01 VITALS — BP 124/66 | HR 97 | Temp 98.2°F | Ht 75.0 in | Wt 164.2 lb

## 2022-09-01 DIAGNOSIS — F1290 Cannabis use, unspecified, uncomplicated: Secondary | ICD-10-CM | POA: Diagnosis not present

## 2022-09-01 DIAGNOSIS — R112 Nausea with vomiting, unspecified: Secondary | ICD-10-CM

## 2022-09-01 MED ORDER — ONDANSETRON HCL 4 MG PO TABS: 4.0000 mg | ORAL_TABLET | Freq: Three times a day (TID) | ORAL | 0 refills | Status: AC | PRN

## 2022-09-01 NOTE — Progress Notes (Signed)
Acute Office Visit  Subjective:     Patient ID: Carlos Underwood, male    DOB: 11-Jul-1995, 27 y.o.   MRN: [id number]  Chief Complaint  Patient presents with   Emesis   Chills   Fever    Pt is experiencing flu like symptoms started this morning. Pt is taking tylenol.   Nausea    Emesis  Associated symptoms include a fever. Pertinent negatives include no abdominal pain or diarrhea.  Fever  Associated symptoms include nausea and vomiting. Pertinent negatives include no abdominal pain or diarrhea.   Carlos Underwood significant medical history presents with complaints of nausea vomiting that started today.  Stated that he ate sloppy Joe yesterday felt like the food was sitting outside for a long time.  He has had about 5-8 emesis today, low grade fever of 99.1 this morning.  Moves his  bowel daily.  Patient currently denies malaise, chest pain, shortness of breath, wheezing, cough, dizziness abdominal pain, hematemesis, bloody stool.  Smokes marijuana daily.  States that his nausea ad vomiting has improved, last emesis was about 5 hours ago.  has improved a lot compared to earlier this morning  He is  due for annual physical examination patient will return in 1 month for a CPE   Review of Systems  Constitutional:  Positive for fever.  Respiratory: Negative.    Cardiovascular: Negative.   Gastrointestinal:  Positive for nausea and vomiting. Negative for abdominal pain, blood in stool, constipation, diarrhea and melena.  Genitourinary: Negative.   Musculoskeletal: Negative.   Neurological: Negative.   Psychiatric/Behavioral: Negative.          Objective:    BP 124/66   Pulse 97   Temp 98.2 F (36.8 C)   Ht 6\' 3"  (1.905 m)   Wt 164 lb 3.2 oz (74.5 kg)   SpO2 100%   BMI 20.52 kg/m    Physical Exam Constitutional:      General: He is not in acute distress.    Appearance: He is not ill-appearing, toxic-appearing or diaphoretic.  Eyes:     General: No scleral icterus.        Right eye: No discharge.        Left eye: No discharge.  Cardiovascular:     Rate and Rhythm: Normal rate.     Pulses: Normal pulses.     Heart sounds: Normal heart sounds. No murmur heard.    No friction rub. No gallop.  Pulmonary:     Effort: No respiratory distress.     Breath sounds: Normal breath sounds. No stridor. No wheezing, rhonchi or rales.  Chest:     Chest wall: No tenderness.  Abdominal:     General: There is no distension.     Tenderness: There is no abdominal tenderness. There is no guarding.  Musculoskeletal:        General: No swelling, tenderness, deformity or signs of injury.     Right lower leg: No edema.     Left lower leg: No edema.  Skin:    General: Skin is warm and dry.     Capillary Refill: Capillary refill takes less than 2 seconds.     Coloration: Skin is not pale.     Findings: No bruising or erythema.  Neurological:     Mental Status: He is alert and oriented to person, place, and time.     Sensory: No sensory deficit.     Motor: No weakness.     Coordination:  Coordination normal.     Gait: Gait normal.  Psychiatric:        Mood and Affect: Mood normal.        Behavior: Behavior normal.        Thought Content: Thought content normal.        Judgment: Judgment normal.     No results found for any visits on 09/01/22.      Assessment & Plan:   Problem List Items Addressed This Visit       Digestive   Nausea and vomiting - Primary    Unspecified nausea, vomiting that has improved Zofran 4 mg every 8 hours as needed ordered Drink at least 64 ounces of water daily to maintain hydration Educational materials on nausea vomiting provided. He was encouraged to avoid smoking marijuana this Could have contributed to his symptoms      Relevant Medications   ondansetron (ZOFRAN) 4 MG tablet     Other   Marijuana smoker    Smokes marijuana daily Need to avoid smoking marijuana discussed with the patient he verbalized understanding        Meds ordered this encounter  Medications   ondansetron (ZOFRAN) 4 MG tablet    Sig: Take 1 tablet (4 mg total) by mouth every 8 (eight) hours as needed for nausea or vomiting.    Dispense:  20 tablet    Refill:  0    Return in about 1 month (around 10/02/2022) for CPE.  Renee Rival, FNP

## 2022-09-01 NOTE — Assessment & Plan Note (Signed)
Smokes marijuana daily Need to avoid smoking marijuana discussed with the patient he verbalized understanding

## 2022-09-01 NOTE — Assessment & Plan Note (Addendum)
Unspecified nausea, vomiting that has improved Zofran 4 mg every 8 hours as needed ordered Drink at least 64 ounces of water daily to maintain hydration Educational materials on nausea vomiting provided. He was encouraged to avoid smoking marijuana this Could have contributed to his symptoms

## 2022-09-01 NOTE — Patient Instructions (Signed)

## 2022-10-02 ENCOUNTER — Ambulatory Visit: Payer: Medicaid Other | Admitting: Nurse Practitioner

## 2023-03-07 ENCOUNTER — Emergency Department (HOSPITAL_COMMUNITY)
Admission: EM | Admit: 2023-03-07 | Discharge: 2023-03-07 | Disposition: A | Discharge status: Home / Self Care | Payer: Medicaid Other | Attending: Emergency Medicine | Admitting: Emergency Medicine

## 2023-03-07 ENCOUNTER — Other Ambulatory Visit: Payer: Self-pay

## 2023-03-07 ENCOUNTER — Encounter (HOSPITAL_COMMUNITY): Payer: Self-pay

## 2023-03-07 DIAGNOSIS — B349 Viral infection, unspecified: Secondary | ICD-10-CM | POA: Insufficient documentation

## 2023-03-07 DIAGNOSIS — R059 Cough, unspecified: Secondary | ICD-10-CM | POA: Diagnosis present

## 2023-03-07 DIAGNOSIS — Z20822 Contact with and (suspected) exposure to covid-19: Secondary | ICD-10-CM | POA: Insufficient documentation

## 2023-03-07 LAB — RESP PANEL BY RT-PCR (RSV, FLU A&B, COVID)  RVPGX2
Influenza A by PCR: NEGATIVE
Influenza B by PCR: NEGATIVE
Resp Syncytial Virus by PCR: NEGATIVE
SARS Coronavirus 2 by RT PCR: NEGATIVE

## 2023-03-07 LAB — GROUP A STREP BY PCR: Group A Strep by PCR: NOT DETECTED

## 2023-03-07 MED ORDER — FLUTICASONE PROPIONATE 50 MCG/ACT NA SUSP: 2.0000 | Freq: Every day | NASAL | 0 refills | Status: AC

## 2023-03-07 MED ORDER — IBUPROFEN 600 MG PO TABS: 600.0000 mg | ORAL_TABLET | Freq: Four times a day (QID) | ORAL | 0 refills | Status: AC | PRN

## 2023-03-07 NOTE — ED Provider Notes (Signed)
[facility] Provider Note   CSN: [id number] Arrival date & time: 03/07/23  4098     History  No chief complaint on file.   [name] is a 27 y.o. male.  HPI  27 year old male with no pertinent past medical history present emergency room today with sinus congestion cough fatigue over the past 6 days he states he had fever several days ago but did not check his temperature.  Denies any shortness of breath or chest pain no other associate symptoms.  He is also telling me that he needs to see a dentist and has some decaying teeth but denies any specific dental pain.       Home Medications Prior to Admission medications   Medication Sig Start Date End Date Taking? Authorizing Provider  fluticasone (FLONASE) 50 MCG/ACT nasal spray Place 2 sprays into both nostrils daily for 14 days. 03/07/23 03/21/23 Yes Latasha Puskas S, PA  ibuprofen (ADVIL) 600 MG tablet Take 1 tablet (600 mg total) by mouth every 6 (six) hours as needed. 03/07/23  Yes Kyrsten Deleeuw, Stevphen Meuse S, PA  acetaminophen (TYLENOL) 500 MG tablet Take 1,000 mg by mouth every 6 (six) hours as needed. For pain    [provider]  Fexofenadine HCl (MUCINEX ALLERGY PO) Take by mouth. Patient not taking: Reported on 09/01/2022    [provider]  ipratropium (ATROVENT) 0.06 % nasal spray Place 2 sprays into both nostrils 4 (four) times daily. Patient not taking: Reported on 07/12/2021 02/17/15   Hayden Rasmussen, NP  ondansetron (ZOFRAN) 4 MG tablet Take 1 tablet (4 mg total) by mouth every 8 (eight) hours as needed for nausea or vomiting. 09/01/22   Donell Beers, FNP      Allergies    Patient has no known allergies.    Review of Systems   Review of Systems  Physical Exam Updated Vital Signs BP 126/81   Pulse 79   Temp 98.6 F (37 C) (Oral)   Resp 15   Ht 6\' 2"  (1.88 m)   Wt 79.4 kg   SpO2 96%   BMI 22.47 kg/m  Physical Exam Vitals and nursing note reviewed.   Constitutional:      General: He is not in acute distress. HENT:     Head: Normocephalic and atraumatic.     Ears:     Comments: Clear fluid bubbles behind bilateral TMs TMs are without bulging appearance or erythema    Nose: Nose normal.     Mouth/Throat:     Mouth: Mucous membranes are moist.     Comments: Tonsils 1+ with uvula midline normal phonation there are exudates on bilateral tonsils.  Patient has multiple decaying teeth but no periapical abscesses evidence.  Tolerating secretions and speaking normally   Eyes:     General: No scleral icterus. Cardiovascular:     Rate and Rhythm: Normal rate and regular rhythm.     Pulses: Normal pulses.     Heart sounds: Normal heart sounds.  Pulmonary:     Effort: Pulmonary effort is normal. No respiratory distress.     Breath sounds: No wheezing.  Abdominal:     Palpations: Abdomen is soft.     Tenderness: There is no abdominal tenderness. There is no guarding or rebound.  Musculoskeletal:     Cervical back: Normal range of motion.     Right lower leg: No edema.     Left lower leg: No edema.  Skin:  General: Skin is warm and dry.     Capillary Refill: Capillary refill takes less than 2 seconds.  Neurological:     Mental Status: He is alert. Mental status is at baseline.  Psychiatric:        Mood and Affect: Mood normal.        Behavior: Behavior normal.     ED Results / Procedures / Treatments   Labs (all labs ordered are listed, but only abnormal results are displayed) Labs Reviewed  RESP PANEL BY RT-PCR (RSV, FLU A&B, COVID)  [id number]  GROUP A STREP BY PCR    EKG None  Radiology No results found.  Procedures Procedures    Medications Ordered in ED Medications - No data to display  ED Course/ Medical Decision Making/ A&P                                 Medical Decision Making Risk Prescription drug management.   27 year old male with no pertinent past medical history present emergency room today with  sinus congestion cough fatigue over the past 6 days he states he had fever several days ago but did not check his temperature.  Denies any shortness of breath or chest pain no other associate symptoms.  He is also telling me that he needs to see a dentist and has some decaying teeth but denies any specific dental pain.  COVID RSV flu negative, strep negative  Counseled patient on viral symptoms and return precautions and conservative therapy at this time, normal vital signs well-appearing in no acute distress.  Will follow-up outpatient fluticasone ibuprofen recommended   Final Clinical Impression(s) / ED Diagnoses Final diagnoses:  Viral illness    Rx / DC Orders ED Discharge Orders          Ordered    fluticasone (FLONASE) 50 MCG/ACT nasal spray  Daily        03/07/23 0953    ibuprofen (ADVIL) 600 MG tablet  Every 6 hours PRN        03/07/23 0953              Gailen Shelter, PA 03/07/23 1610    Jacalyn Lefevre, MD 03/08/23 1550

## 2023-03-07 NOTE — Discharge Instructions (Addendum)
Your strep, COVID, flu, RSV test were all negative.  You certainly do have some evidence of a viral illness including your sinus congestion and the appearance of the back your throat.  I do not believe that your symptoms are related to your decaying teeth.  You will need to follow-up with a dentist I have given you the information for 1 that you can follow-up with  Viral Illness TREATMENT  Treatment is directed at relieving symptoms. There is no cure. Antibiotics are not effective, because the infection is caused by a virus, not by bacteria. Treatment may include:  Increased fluid intake. Sports drinks offer valuable electrolytes, sugars, and fluids.  Breathing heated mist or steam (vaporizer or shower).  Eating chicken soup or other clear broths, and maintaining good nutrition.  Getting plenty of rest.  Using gargles or lozenges for comfort.  Increasing usage of your inhaler if you have asthma.  Return to work when your temperature has returned to normal.  Gargle warm salt water and spit it out for sore throat. Take benadryl to decrease sinus secretions. Continue to alternate between Tylenol and ibuprofen for pain and fever control.  Follow Up: Follow up with your primary care doctor in 5-7 days for recheck of ongoing symptoms.  Return to emergency department for emergent changing or worsening of symptoms.     Please use Tylenol or ibuprofen for pain.  You may use 600 mg ibuprofen every 6 hours or 1000 mg of Tylenol every 6 hours.  You may choose to alternate between the 2.  This would be most effective.  Not to exceed 4 g of Tylenol within 24 hours.  Not to exceed 3200 mg ibuprofen 24 hours.

## 2023-03-07 NOTE — ED Triage Notes (Signed)
Pt arrived to ED via POV d/t last week having flu s/s that dissolved & the days after until now has been having a sore throat & now feeling pain in both ears. Has had fevers in the last week but not upon arrival today. A/Ox4, rates pain 5/10 while in triage now.

## 2023-08-07 ENCOUNTER — Ambulatory Visit: Payer: Self-pay | Admitting: Nurse Practitioner

## 2023-08-07 ENCOUNTER — Ambulatory Visit
Admission: EM | Admit: 2023-08-07 | Discharge: 2023-08-07 | Disposition: A | Discharge status: Home / Self Care | Payer: 59 | Attending: Family Medicine | Admitting: Family Medicine

## 2023-08-07 ENCOUNTER — Ambulatory Visit: Payer: Self-pay

## 2023-08-07 DIAGNOSIS — H1033 Unspecified acute conjunctivitis, bilateral: Secondary | ICD-10-CM

## 2023-08-07 MED ORDER — MOXIFLOXACIN HCL 0.5 % OP SOLN: 1.0000 [drp] | Freq: Three times a day (TID) | OPHTHALMIC | 0 refills | Status: AC

## 2023-08-07 NOTE — Telephone Encounter (Signed)
  Chief Complaint: Eye Drainage Symptoms: Redness, burning and drainage (pus) in both eyes Frequency: 2 days Pertinent Negatives: Patient denies CP, SOB Disposition: [] ED /[x] Urgent Care (no appt availability in office) / [] Appointment(In office/virtual)/ []  [facility]/ [] Home Care/ [] Refused Recommended Disposition /[] [facility]/ []  Follow-up with PCP Additional Notes: patient's mother called in for patient. Patient having 2 days of bilateral eye drainage (pus), eye redness and burning to eyes. No sick contacts. Per protocol, appointment is the recommendation. No available appointment in PCP office until March 7th. Recommendation for Urgent Care visit along with being mother's request. Appointment made for patient with urgent care 08/07/2023 at 1:00 pm. Verbalized understanding of plan and all questions answered.    Copied from CRM [id number]. Topic: Clinical - Red Word Triage >> Aug 07, 2023 10:02 AM Antwanette L wrote: Red Word that prompted transfer to Nurse Triage: Both eyes are red, oozing, and burning. Reason for Disposition  MODERATE eye pain (e.g., interferes with normal activities)  Answer Assessment - Initial Assessment Questions 1. EYE DISCHARGE: "Is the discharge in one or both eyes?" "What color is it?" "How much is there?" "When did the discharge start?"      Both eyes are draining pus and redness. Discharge started two days ago. 2. REDNESS OF SCLERA: "Is the redness in one or both eyes?" "When did the redness start?"      Redness is both 3. EYELIDS: "Are the eyelids red or swollen?" If Yes, ask: "How much?"      Swollen and red 4. VISION: "Is there any difficulty seeing clearly?"      Mom is unsure 5. PAIN: "Is there any pain? If Yes, ask: "How bad is it?" (Scale 1-10; or mild, moderate, severe)    - MILD (1-3): doesn't interfere with normal activities     - MODERATE (4-7): interferes with normal activities or awakens from sleep    - SEVERE (8-10):  excruciating pain, unable to do any normal activities       Severe 6. CONTACT LENS: "Do you wear contacts?"     No wears glasses 7. OTHER SYMPTOMS: "Do you have any other symptoms?" (e.g., fever, runny nose, cough)     No  Protocols used: Eye - Pus or Discharge-A-AH

## 2023-08-07 NOTE — ED Triage Notes (Signed)
"  I have an eye infection, I noticed on Tuesday that my right eye was red/drainage and now moved to my left". No pain, "Just irritation". "Watery and drainage". No fever. No injury.

## 2023-08-07 NOTE — Discharge Instructions (Signed)
 You were diagnosed with bacterial eye infection.  I have sent out an eye drop to use for this infection.  Please wash your hands and avoid touching your eyes.  Follow up if not improving.

## 2023-08-07 NOTE — ED Triage Notes (Signed)
"  Also, bthw my ears still fill clogged".

## 2023-08-07 NOTE — ED Provider Notes (Signed)
 [facility]    CSN: [id number] Arrival date & time: 08/07/23  1304      History   Chief Complaint Chief Complaint  Patient presents with   Eye Problem    HPI Carlos Underwood is a 28 y.o. male.    Eye Problem Associated symptoms: discharge and redness    Patient is here for bilateral eye redness with drainage.  Matted shut in the morning.  Mild runny nose.  He does not wear contacts.        Past Medical History:  Diagnosis Date   Anxiety    Eczema     Patient Active Problem List   Diagnosis Date Noted   Nausea and vomiting 09/01/2022   Marijuana smoker 09/01/2022    History reviewed. No pertinent surgical history.     Home Medications    Prior to Admission medications   Medication Sig Start Date End Date Taking? Authorizing Provider  acetaminophen (TYLENOL) 500 MG tablet Take 1,000 mg by mouth every 6 (six) hours as needed. For pain    [provider]  Fexofenadine HCl (MUCINEX ALLERGY PO) Take by mouth. Patient not taking: Reported on 09/01/2022    [provider]  fluticasone (FLONASE) 50 MCG/ACT nasal spray Place 2 sprays into both nostrils daily for 14 days. 03/07/23 03/21/23  Gailen Shelter, PA  ibuprofen (ADVIL) 600 MG tablet Take 1 tablet (600 mg total) by mouth every 6 (six) hours as needed. 03/07/23   Gailen Shelter, PA  ipratropium (ATROVENT) 0.06 % nasal spray Place 2 sprays into both nostrils 4 (four) times daily. Patient not taking: Reported on 07/12/2021 02/17/15   Hayden Rasmussen, NP  ondansetron (ZOFRAN) 4 MG tablet Take 1 tablet (4 mg total) by mouth every 8 (eight) hours as needed for nausea or vomiting. 09/01/22   Paseda, Baird Kay, FNP    Family History Family History  Problem Relation Age of Onset   Heart failure Mother    Healthy Father     Social History Social History   Tobacco Use   Smoking status: Some Days    Types: Cigars    Passive exposure: Never   Smokeless tobacco: Never  Vaping Use    Vaping status: Never Used  Substance Use Topics   Alcohol use: Yes    Comment: social    Drug use: Yes    Types: Marijuana     Allergies   Patient has no known allergies.   Review of Systems Review of Systems  Constitutional: Negative.   HENT: Negative.    Eyes:  Positive for discharge and redness.  Respiratory: Negative.    Gastrointestinal: Negative.   Musculoskeletal: Negative.   Psychiatric/Behavioral: Negative.       Physical Exam Triage Vital Signs ED Triage Vitals  Encounter Vitals Group     BP 08/07/23 1318 106/64     Systolic BP Percentile --      Diastolic BP Percentile --      Pulse Rate 08/07/23 1318 69     Resp 08/07/23 1318 18     Temp 08/07/23 1318 98 F (36.7 C)     Temp Source 08/07/23 1318 Oral     SpO2 08/07/23 1318 98 %     Weight 08/07/23 1316 180 lb (81.6 kg)     Height 08/07/23 1316 6\' 2"  (1.88 m)     Head Circumference --      Peak Flow --      Pain Score 08/07/23 1314  0     Pain Loc --      Pain Education --      Exclude from Growth Chart --    No data found.  Updated Vital Signs BP 106/64 (BP Location: Left Arm)   Pulse 69   Temp 98 F (36.7 C) (Oral)   Resp 18   Ht 6\' 2"  (1.88 m)   Wt 81.6 kg   SpO2 98%   BMI 23.11 kg/m   Visual Acuity Right Eye Distance: 20/30 (Corrected) Left Eye Distance: 20/30 (Corrected) Bilateral Distance: 20/20 (Corrected)  Right Eye Near:   Left Eye Near:    Bilateral Near:     Physical Exam Constitutional:      Appearance: Normal appearance.  Eyes:     General:        Right eye: Discharge present.        Left eye: Discharge present.    Conjunctiva/sclera:     Right eye: Right conjunctiva is injected.     Left eye: Left conjunctiva is injected.     Pupils: Pupils are equal, round, and reactive to light.  Musculoskeletal:     Cervical back: Normal range of motion and neck supple. No tenderness.  Skin:    General: Skin is warm.  Neurological:     General: No focal deficit  present.     Mental Status: He is alert.  Psychiatric:        Mood and Affect: Mood normal.      UC Treatments / Results  Labs (all labs ordered are listed, but only abnormal results are displayed) Labs Reviewed - No data to display  EKG   Radiology No results found.  Procedures Procedures (including critical care time)  Medications Ordered in UC Medications - No data to display  Initial Impression / Assessment and Plan / UC Course  I have reviewed the triage vital signs and the nursing notes.  Pertinent labs & imaging results that were available during my care of the patient were reviewed by me and considered in my medical decision making (see chart for details).  Final Clinical Impressions(s) / UC Diagnoses   Final diagnoses:  Acute bacterial conjunctivitis of both eyes     Discharge Instructions      You were diagnosed with bacterial eye infection.  I have sent out an eye drop to use for this infection.  Please wash your hands and avoid touching your eyes.  Follow up if not improving.     ED Prescriptions     Medication Sig Dispense Auth. Provider   moxifloxacin (VIGAMOX) 0.5 % ophthalmic solution Place 1 drop into both eyes 3 (three) times daily. 3 mL Jannifer Franklin, MD      PDMP not reviewed this encounter.   Jannifer Franklin, MD 08/07/23 1340

## 2024-02-17 DIAGNOSIS — K029 Dental caries, unspecified: Secondary | ICD-10-CM | POA: Diagnosis not present

## 2024-03-02 DIAGNOSIS — K029 Dental caries, unspecified: Secondary | ICD-10-CM | POA: Diagnosis not present

## 2024-04-13 DIAGNOSIS — K029 Dental caries, unspecified: Secondary | ICD-10-CM | POA: Diagnosis not present
